# Patient Record
Sex: Male | Born: 1990 | Race: White | Hispanic: No | Marital: Single | Smoking: Current some day smoker
Health system: Southern US, Community
[De-identification: ages and names within clinical notes are randomized; demographics above are authoritative.]

## PROBLEM LIST (undated history)

## (undated) HISTORY — PX: HERNIA REPAIR: SHX51

---
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
## 2010-01-01 ENCOUNTER — Emergency Department (HOSPITAL_COMMUNITY): Admission: EM | Admit: 2010-01-01 | Discharge: 2010-01-02 | Payer: Self-pay | Admitting: Emergency Medicine

## 2011-02-03 NOTE — Op Note (Signed)
[name], FENOGLIO NO.:  [id number]   MEDICAL RECORD NO.:  [id number]          PATIENT TYPE:  AMB   LOCATION:  DAY                          FACILITY:  [facility]   PHYSICIAN:  Ollen Gross, M.D.    DATE OF BIRTH:  January 18, 1991   DATE OF PROCEDURE:  03/04/2007  DATE OF DISCHARGE:                               OPERATIVE REPORT   PREOPERATIVE DIAGNOSIS:  Right hip labral tear.   POSTOPERATIVE DIAGNOSES:  1. Right hip labral tear.  2. Chondral defect of acetabulum.   PROCEDURE:  Right hip arthroscopy with labral debridement and  chondroplasty.   SURGEON:  Ollen Gross, M.D.   ASSISTANT:  No assistant.   ANESTHESIA:  General.   BLOOD LOSS:  Minimal.   DRAINS:  None.   COMPLICATIONS:  None.   CONDITION:  Stable to Recovery.   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  The pain is coming intractable.  He had an MRI  scan performed which was consistent with anterior labral tear.  He  presents now for arthroscopy and debridement due to intractable pain.   PROCEDURE IN DETAIL:  After the successful administration of general  anesthetic, the patient is placed in the left lateral decubitus position  with the right side up.  The lower leg is on the well-padded leg holder.  The well-padded perineal post is then placed with his right leg draped  over it.  The foot is placed in the well-padded foot holder and then  traction applied across the right lower extremity under fluoroscopic  guidance.  Once the hip is adequately distracted, then traction is  locked in this position.  The thigh is then prepped and draped in the  usual sterile fashion.  The spinal needles are then passed at the sites  of the anterior and posterior peritrochanteric portals and felt to enter  the joint.  About 30 mL of saline are injected through the posterior  needle and it  showed outflow through the anterior needle, confirming  both are intra-articular.  The small incision is made around the needle.  Nitinol wire is passed, dilator passed and a 5-mm cannula placed  posteriorly.  The camera is passed into the joint and arthroscopic  visualization proceeds.  Femoral head looks perfect throughout its  extent.  The fovea, central acetabulum and posterior acetabulum, as well  as superior and posterior labrum all look normal.  Anteriorly, the  anterior labrum looks fine up until about the 5 o'clock position, where  there is a tear.  There also appears to be chondral blistering at the  anteroinferior acetabulum around the 4 o'clock position going to the 3  o'clock position.  The anterior portal is created and a shaver is used  to debride the labrum back to a stable base.  It is then sealed off with  the ArthroCare device.  The chondral blistering area is probed and  indeed it is a defect off the acetabulum.  It is debrided back to  a  stable cartilaginous base with the shaver.  The size of the defect is  about 1 x 1 cm.  The tip of shaver is used to probe this defect at the  base and it does not go all the way down to bone.  The joint is again  inspected, no other further chondral defects, labral tears or loose  bodies.  I then remove the equipment from the anterior portal and inject  20 mL of 0.25% Marcaine through the posterior portal; that cannula is  then removed and traction is let off of the joint.  The fluoro picture  confirms concentric reduction.  The incisions are then closed with  interrupted 4-0 nylon and a bulky sterile dressing applied.  He is then  taken out of the traction boot, placed supine, awakened and transported  to recovery in stable condition.      Ollen Gross, M.D.  Electronically Signed     FA/MEDQ  D:  03/04/2007  T:  03/05/2007  Job:  [id number]

## 2011-02-03 NOTE — Op Note (Signed)
NAMEQUINTELL, BONNIN NO.:  [id number]   MEDICAL RECORD NO.:  [id number]          PATIENT TYPE:  AMB   LOCATION:  DSC                          FACILITY:  [facility]   PHYSICIAN:  Eulas Post, MD    DATE OF BIRTH:  [birth year]   DATE OF PROCEDURE:  01/16/2008  DATE OF DISCHARGE:                               OPERATIVE REPORT   PREOPERATIVE DIAGNOSIS:  Right fifth metacarpal shaft fracture.   POSTOPERATIVE DIAGNOSIS:  Right fifth metacarpal shaft fracture.   OPERATIVE PROCEDURE:  Open reduction with percutaneous K-wire fixation  of the right fifth metacarpal shaft.   ANESTHESIA:  Regional.   ESTIMATED BLOOD LOSS:  Minimal.   TOURNIQUET TIME:  24 minutes.   OPERATIVE IMPLANTS:  0.45 K-wire x2.   PREOPERATIVE INDICATIONS:  Mr. Johny Pitstick is a 20 year old right-hand-  dominant young man who fell off playing basketball onto his right upper  extremity and broke his fifth metacarpal.  He had about 50 degrees of  angulation.  He elected to undergo the above-named procedure.  The  risks, benefits, and alternatives to operative intervention were  discussed with him preoperatively including but not limited to risks of  infection, bleeding, nerve injury, malunion, nonunion, stiffness, loss  of function, pin breakage, need for hardware removal, cardiopulmonary  complications, among others, and he was willing to proceed.   OPERATIVE FINDINGS:  There was midshaft fracture.  We were able to  reduce it anatomically under direct visualization.  We had excellent  fixation with the K-wires.  Rotational alignment was confirmed.   OPERATIVE PROCEDURE:  The patient was brought to the operating room and  placed in a supine position.  Regional anesthesia was administered.  1  gram intravenous Ancef was given.  The right upper extremity was prepped  and draped in the usual sterile fashion.  Incision was made over the  fifth metacarpal.  The fracture was exposed and reduced.   K-wire was  introduced from the radial and ulnar aspects of the metacarpal.  Care  was taken not to involve the proximal phalanx.  Excellent fixation was  achieved and satisfactory reduction confirmed by C-arm.  We irrigated  the wounds copiously and closed with nylon.  He will need stitch  removal.  He was placed in ulnar gutter splint.  We are going to plan to  see him back in about 2 weeks for suture removal and placement of a  short-arm cast at that time.     Eulas Post, MD  Electronically Signed    JPL/MEDQ  D:  01/16/2008  T:  01/17/2008  Job:  [id number]

## 2011-07-09 LAB — APTT: aPTT: 28

## 2011-07-09 LAB — HEMOGLOBIN AND HEMATOCRIT, BLOOD
HCT: 45.7
Hemoglobin: 15.6

## 2011-07-09 LAB — PROTIME-INR: INR: 1.1
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
## 2016-12-18 ENCOUNTER — Other Ambulatory Visit: Payer: Self-pay | Admitting: Family Medicine

## 2016-12-18 ENCOUNTER — Encounter: Payer: Self-pay | Admitting: Emergency Medicine

## 2016-12-18 ENCOUNTER — Emergency Department (INDEPENDENT_AMBULATORY_CARE_PROVIDER_SITE_OTHER)
Admission: EM | Admit: 2016-12-18 | Discharge: 2016-12-18 | Disposition: A | Discharge status: Home / Self Care | Payer: BLUE CROSS/BLUE SHIELD | Source: Home / Self Care | Attending: Family Medicine | Admitting: Family Medicine

## 2016-12-18 DIAGNOSIS — J101 Influenza due to other identified influenza virus with other respiratory manifestations: Secondary | ICD-10-CM

## 2016-12-18 LAB — POCT CBC W AUTO DIFF (K'VILLE URGENT CARE)

## 2016-12-18 LAB — POCT INFLUENZA A/B
INFLUENZA B, POC: NEGATIVE
Influenza A, POC: POSITIVE — AB

## 2016-12-18 MED ORDER — GUAIFENESIN-CODEINE 100-10 MG/5ML PO SOLN: ORAL | 0 refills | Status: DC

## 2016-12-18 MED ORDER — OSELTAMIVIR PHOSPHATE 75 MG PO CAPS: 75.0000 mg | ORAL_CAPSULE | Freq: Two times a day (BID) | ORAL | 0 refills | Status: DC

## 2016-12-18 NOTE — Discharge Instructions (Signed)
Take plain guaifenesin (1200mg extended release tabs such as Mucinex) twice daily, with plenty of water, for cough and congestion.  May add Pseudoephedrine (30mg, one or two every 4 to 6 hours) for sinus congestion.  Get adequate rest.   °Try warm salt water gargles for sore throat.  °Stop all antihistamines for now, and other non-prescription cough/cold preparations. °May take Ibuprofen 200mg, 4 tabs every 8 hours with food for body aches, headache, etc. °  °

## 2016-12-18 NOTE — ED Triage Notes (Signed)
Patient presents today and states that he found a tick in his groin area. He has a complaint, of neck and back pain, along with fever, chills and diarrhea. Patient states that he started feeling bad this morning.

## 2016-12-18 NOTE — ED Provider Notes (Signed)
[facility]    CSN: [id number] Arrival date & time: 12/18/16  0931     History   Chief Complaint Chief Complaint  Patient presents with  . Insect Bite    Found tick in Groin Area this morning   . Back Pain  . Neck Pain  . Fever    HPI Brett Bishop is a 26 y.o. male.   This morning patient developed flu-like illness including myalgias, headache, chills, fatigue, nasal congestion, minimal sore throat, and cough. Cough is non-productive.  No pleuritic pain or shortness of breath.  He had some loose stools yesterday.  Last night he found a small non-engorged tick in his groin area.  He denies rash.   The history is provided by the patient and the spouse.    History reviewed. No pertinent past medical history.  There are no active problems to display for this patient.   Past Surgical History:  Procedure Laterality Date  . HERNIA REPAIR     61 months old       Home Medications    Prior to Admission medications   Medication Sig Start Date End Date Taking? Authorizing Provider  guaiFENesin-codeine 100-10 MG/5ML syrup Take 10mL by mouth at bedtime as needed for cough 12/18/16   Lattie Haw, MD  oseltamivir (TAMIFLU) 75 MG capsule Take 1 capsule (75 mg total) by mouth every 12 (twelve) hours. 12/18/16   Lattie Haw, MD    Family History History reviewed. No pertinent family history.  Social History Social History  Substance Use Topics  . Smoking status: Current Some Day Smoker    Packs/day: 0.50    Types: Cigarettes  . Smokeless tobacco: Current User  . Alcohol use Yes     Allergies   Patient has no known allergies.   Review of Systems Review of Systems + mild sore throat + cough No pleuritic pain No wheezing + nasal congestion + post-nasal drainage No sinus pain/pressure No itchy/red eyes No earache No hemoptysis No SOB No fever, + chills No nausea No vomiting No abdominal pain No diarrhea No urinary symptoms No skin  rash + fatigue + myalgias + headache Used OTC meds without relief   Physical Exam Triage Vital Signs ED Triage Vitals  Enc Vitals Group     BP 12/18/16 1002 113/73     Pulse Rate 12/18/16 1002 100     Resp --      Temp 12/18/16 1002 100 F (37.8 C)     Temp Source 12/18/16 1002 Oral     SpO2 12/18/16 1002 99 %     Weight --      Height --      Head Circumference --      Peak Flow --      Pain Score 12/18/16 1128 5     Pain Loc --      Pain Edu? --      Excl. in GC? --    No data found.   Updated Vital Signs BP 113/73 (BP Location: Right Arm)   Pulse 100   Temp 100 F (37.8 C) (Oral)   SpO2 99%   Visual Acuity Right Eye Distance:   Left Eye Distance:   Bilateral Distance:    Right Eye Near:   Left Eye Near:    Bilateral Near:     Physical Exam Nursing notes and Vital Signs reviewed. Appearance:  Patient appears stated age, and in no acute distress Eyes:  Pupils are equal,  round, and reactive to light and accomodation.  Extraocular movement is intact.  Conjunctivae are not inflamed  Ears:  Canals normal.  Tympanic membranes normal.  Nose:  Mildly congested turbinates.  No sinus tenderness.   Pharynx:  Normal Neck:  Supple.  Tender enlarged posterior/lateral nodes are palpated bilaterally  Lungs:  Clear to auscultation.  Breath sounds are equal.  Moving air well. Heart:  Regular rate and rhythm without murmurs, rubs, or gallops.  Abdomen:  Nontender without masses or hepatosplenomegaly.  Bowel sounds are present.  No CVA or flank tenderness.  Extremities:  No edema.  Skin:  No rash present.    UC Treatments / Results  Labs (all labs ordered are listed, but only abnormal results are displayed) Labs Reviewed  POCT INFLUENZA A/B - Abnormal; Notable for the following:       Result Value   Influenza A, POC Positive (*)    All other components within normal limits  ROCKY MTN SPOTTED FVR ABS PNL(IGG+IGM)  B. BURGDORFI ANTIBODIES POCT CBC:  WBC 4.3; LY 7.6;  MO 4.5; GR 87.9; Hgb 15.3; Platelets 191     EKG  EKG Interpretation None       Radiology No results found.  Procedures Procedures (including critical care time)  Medications Ordered in UC Medications - No data to display   Initial Impression / Assessment and Plan / UC Course  I have reviewed the triage vital signs and the nursing notes.  Pertinent labs & imaging results that were available during my care of the patient were reviewed by me and considered in my medical decision making (see chart for details).    Begin Tamiflu.  Rx for Robitussin AC for night time cough.  Tick bite exposure minimal; doubt tick disease.  Will send titers for RMSF and Lyme disease. Take plain guaifenesin (  extended release tabs such as Mucinex) twice daily, with plenty of water, for cough and congestion.  May add Pseudoephedrine ( , one or two every 4 to 6 hours) for sinus congestion.  Get adequate rest.   Try warm salt water gargles for sore throat.  Stop all antihistamines for now, and other non-prescription cough/cold preparations. May take Ibuprofen , 4 tabs every 8 hours with food for body aches, headache, etc. Followup with Family Doctor if not improved in one week.    Final Clinical Impressions(s) / UC Diagnoses   Final diagnoses:  Influenza A    New Prescriptions New Prescriptions   GUAIFENESIN-CODEINE 100-10 MG/5ML SYRUP    Take 10mL by mouth at bedtime as needed for cough   OSELTAMIVIR (TAMIFLU) 75 MG CAPSULE    Take 1 capsule (75 mg total) by mouth every 12 (twelve) hours.     Lattie Haw, MD 12/28/16 [phone]

## 2016-12-20 LAB — ROCKY MTN SPOTTED FVR ABS PNL(IGG+IGM)
RMSF IGG: NOT DETECTED
RMSF IgM: NOT DETECTED

## 2016-12-21 ENCOUNTER — Telehealth: Payer: Self-pay

## 2016-12-21 LAB — LYME AB/WESTERN BLOT REFLEX: B burgdorferi Ab IgG+IgM: <0.9 Index (ref <0.90)

## 2016-12-21 NOTE — Telephone Encounter (Signed)
Pt called for lab results, give.  One still pending, will call back.

## 2016-12-22 ENCOUNTER — Telehealth: Payer: Self-pay | Admitting: *Deleted

## 2016-12-22 NOTE — Telephone Encounter (Signed)
Spoke to pt given lab results. He reports that he is feeling better.

## 2018-04-14 ENCOUNTER — Emergency Department (HOSPITAL_COMMUNITY)
Admission: EM | Admit: 2018-04-14 | Discharge: 2018-04-14 | Disposition: A | Discharge status: Home / Self Care | Payer: BLUE CROSS/BLUE SHIELD | Attending: Emergency Medicine | Admitting: Emergency Medicine

## 2018-04-14 ENCOUNTER — Encounter (HOSPITAL_COMMUNITY): Payer: Self-pay | Admitting: Emergency Medicine

## 2018-04-14 ENCOUNTER — Other Ambulatory Visit: Payer: Self-pay

## 2018-04-14 DIAGNOSIS — F10920 Alcohol use, unspecified with intoxication, uncomplicated: Secondary | ICD-10-CM | POA: Insufficient documentation

## 2018-04-14 DIAGNOSIS — T40601A Poisoning by unspecified narcotics, accidental (unintentional), initial encounter: Secondary | ICD-10-CM | POA: Insufficient documentation

## 2018-04-14 DIAGNOSIS — R404 Transient alteration of awareness: Secondary | ICD-10-CM | POA: Diagnosis present

## 2018-04-14 DIAGNOSIS — F17210 Nicotine dependence, cigarettes, uncomplicated: Secondary | ICD-10-CM | POA: Diagnosis not present

## 2018-04-14 LAB — COMPREHENSIVE METABOLIC PANEL
ALK PHOS: 52 U/L (ref 38–126)
ALT: 15 U/L (ref 0–44)
AST: 29 U/L (ref 15–41)
Albumin: 4.1 g/dL (ref 3.5–5.0)
Anion gap: 13 (ref 5–15)
BILIRUBIN TOTAL: 0.7 mg/dL (ref 0.3–1.2)
BUN: 11 mg/dL (ref 6–20)
CALCIUM: 8.9 mg/dL (ref 8.9–10.3)
CO2: 22 mmol/L (ref 22–32)
CREATININE: 1.1 mg/dL (ref 0.61–1.24)
Chloride: 103 mmol/L (ref 98–111)
Glucose, Bld: 108 mg/dL — ABNORMAL HIGH (ref 70–99)
Potassium: 4.2 mmol/L (ref 3.5–5.1)
Sodium: 138 mmol/L (ref 135–145)
TOTAL PROTEIN: 6.5 g/dL (ref 6.5–8.1)

## 2018-04-14 LAB — CBC WITH DIFFERENTIAL/PLATELET
Abs Immature Granulocytes: 0 10*3/uL (ref 0.0–0.1)
Basophils Absolute: 0.1 10*3/uL (ref 0.0–0.1)
Basophils Relative: 1 %
Eosinophils Absolute: 0.3 10*3/uL (ref 0.0–0.7)
Eosinophils Relative: 4 %
HEMATOCRIT: 43.8 % (ref 39.0–52.0)
Hemoglobin: 14.6 g/dL (ref 13.0–17.0)
IMMATURE GRANULOCYTES: 0 %
LYMPHS ABS: 2.7 10*3/uL (ref 0.7–4.0)
Lymphocytes Relative: 34 %
MCH: 30.6 pg (ref 26.0–34.0)
MCHC: 33.3 g/dL (ref 30.0–36.0)
MCV: 91.8 fL (ref 78.0–100.0)
MONO ABS: 0.6 10*3/uL (ref 0.1–1.0)
MONOS PCT: 8 %
NEUTROS PCT: 53 %
Neutro Abs: 4.2 10*3/uL (ref 1.7–7.7)
Platelets: 238 10*3/uL (ref 150–400)
RBC: 4.77 MIL/uL (ref 4.22–5.81)
RDW: 12.3 % (ref 11.5–15.5)
WBC: 7.8 10*3/uL (ref 4.0–10.5)

## 2018-04-14 LAB — RAPID URINE DRUG SCREEN, HOSP PERFORMED
AMPHETAMINES: NOT DETECTED
BARBITURATES: NOT DETECTED
BENZODIAZEPINES: NOT DETECTED
Cocaine: NOT DETECTED
Opiates: POSITIVE — AB
Tetrahydrocannabinol: POSITIVE — AB

## 2018-04-14 LAB — SALICYLATE LEVEL: Salicylate Lvl: <7 mg/dL (ref 2.8–30.0)

## 2018-04-14 LAB — ETHANOL: Alcohol, Ethyl (B): 111 mg/dL — ABNORMAL HIGH (ref <10)

## 2018-04-14 LAB — ACETAMINOPHEN LEVEL

## 2018-04-14 NOTE — ED Notes (Signed)
Pt states that he is ready to leave because he wants to go smoke, pt agitated does not want to wait for paperwork, MD states he need to be observed until 430, pt refuses continued vital signs until discharge. Informed pt he would have to leave AMA if he leaves before MD discharge. Pt agrees to stay.

## 2018-04-14 NOTE — Discharge Instructions (Signed)
Do not use any drugs except for what a healthcare provider has prescribed for you.  And then, only use it exactly as it is prescribed.
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
## 2021-10-22 DEATH — deceased
